# Patient Record
Sex: Female | Born: 2005 | Race: White | Hispanic: No | Marital: Single | Smoking: Never smoker
Health system: Southern US, Community
[De-identification: ages and names within clinical notes are randomized; demographics above are authoritative.]

## PROBLEM LIST (undated history)

## (undated) DIAGNOSIS — J309 Allergic rhinitis, unspecified: Secondary | ICD-10-CM

## (undated) HISTORY — DX: Allergic rhinitis, unspecified: J30.9

---
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
## 2023-02-04 DIAGNOSIS — M9901 Segmental and somatic dysfunction of cervical region: Secondary | ICD-10-CM | POA: Diagnosis not present

## 2023-02-04 DIAGNOSIS — M9903 Segmental and somatic dysfunction of lumbar region: Secondary | ICD-10-CM | POA: Diagnosis not present

## 2023-02-04 DIAGNOSIS — M9902 Segmental and somatic dysfunction of thoracic region: Secondary | ICD-10-CM | POA: Diagnosis not present

## 2023-02-04 DIAGNOSIS — M9904 Segmental and somatic dysfunction of sacral region: Secondary | ICD-10-CM | POA: Diagnosis not present

## 2023-02-10 DIAGNOSIS — M9903 Segmental and somatic dysfunction of lumbar region: Secondary | ICD-10-CM | POA: Diagnosis not present

## 2023-02-10 DIAGNOSIS — M9902 Segmental and somatic dysfunction of thoracic region: Secondary | ICD-10-CM | POA: Diagnosis not present

## 2023-02-10 DIAGNOSIS — M9904 Segmental and somatic dysfunction of sacral region: Secondary | ICD-10-CM | POA: Diagnosis not present

## 2023-02-10 DIAGNOSIS — M9901 Segmental and somatic dysfunction of cervical region: Secondary | ICD-10-CM | POA: Diagnosis not present

## 2023-02-24 DIAGNOSIS — M9902 Segmental and somatic dysfunction of thoracic region: Secondary | ICD-10-CM | POA: Diagnosis not present

## 2023-02-24 DIAGNOSIS — M9904 Segmental and somatic dysfunction of sacral region: Secondary | ICD-10-CM | POA: Diagnosis not present

## 2023-02-24 DIAGNOSIS — M9903 Segmental and somatic dysfunction of lumbar region: Secondary | ICD-10-CM | POA: Diagnosis not present

## 2023-02-24 DIAGNOSIS — M9901 Segmental and somatic dysfunction of cervical region: Secondary | ICD-10-CM | POA: Diagnosis not present

## 2023-03-01 DIAGNOSIS — J029 Acute pharyngitis, unspecified: Secondary | ICD-10-CM | POA: Diagnosis not present

## 2023-06-02 DIAGNOSIS — R051 Acute cough: Secondary | ICD-10-CM | POA: Diagnosis not present

## 2023-06-02 DIAGNOSIS — J039 Acute tonsillitis, unspecified: Secondary | ICD-10-CM | POA: Diagnosis not present

## 2023-06-02 DIAGNOSIS — R509 Fever, unspecified: Secondary | ICD-10-CM | POA: Diagnosis not present

## 2023-06-28 DIAGNOSIS — L814 Other melanin hyperpigmentation: Secondary | ICD-10-CM | POA: Diagnosis not present

## 2023-06-28 DIAGNOSIS — L578 Other skin changes due to chronic exposure to nonionizing radiation: Secondary | ICD-10-CM | POA: Diagnosis not present

## 2023-06-28 DIAGNOSIS — D2271 Melanocytic nevi of right lower limb, including hip: Secondary | ICD-10-CM | POA: Diagnosis not present

## 2023-06-28 DIAGNOSIS — D225 Melanocytic nevi of trunk: Secondary | ICD-10-CM | POA: Diagnosis not present
